# Patient Record
Sex: Male | Born: 1970 | Race: White | Hispanic: No | Marital: Married | Smoking: Current every day smoker
Health system: Southern US, Community
[De-identification: ages and names within clinical notes are randomized; demographics above are authoritative.]

## PROBLEM LIST (undated history)

## (undated) DIAGNOSIS — I10 Essential (primary) hypertension: Secondary | ICD-10-CM

## (undated) HISTORY — PX: BACK SURGERY: SHX140

## (undated) HISTORY — PX: EYE SURGERY: SHX253

---

## 2001-06-27 ENCOUNTER — Emergency Department (HOSPITAL_COMMUNITY): Admission: EM | Admit: 2001-06-27 | Discharge: 2001-06-27 | Payer: Self-pay | Admitting: Emergency Medicine

## 2001-08-15 ENCOUNTER — Emergency Department (HOSPITAL_COMMUNITY): Admission: EM | Admit: 2001-08-15 | Discharge: 2001-08-15 | Payer: Self-pay | Admitting: *Deleted

## 2001-10-07 ENCOUNTER — Observation Stay (HOSPITAL_COMMUNITY): Admission: RE | Admit: 2001-10-07 | Discharge: 2001-10-08 | Payer: Self-pay | Admitting: Neurosurgery

## 2001-10-07 ENCOUNTER — Encounter: Payer: Self-pay | Admitting: Neurosurgery

## 2002-01-25 ENCOUNTER — Ambulatory Visit (HOSPITAL_COMMUNITY): Admission: RE | Admit: 2002-01-25 | Discharge: 2002-01-25 | Payer: Self-pay | Admitting: Internal Medicine

## 2002-01-25 ENCOUNTER — Encounter: Payer: Self-pay | Admitting: Internal Medicine

## 2002-08-14 ENCOUNTER — Ambulatory Visit (HOSPITAL_COMMUNITY): Admission: RE | Admit: 2002-08-14 | Discharge: 2002-08-14 | Payer: Self-pay | Admitting: Internal Medicine

## 2002-08-14 ENCOUNTER — Encounter: Payer: Self-pay | Admitting: Internal Medicine

## 2003-02-05 ENCOUNTER — Emergency Department (HOSPITAL_COMMUNITY): Admission: EM | Admit: 2003-02-05 | Discharge: 2003-02-05 | Payer: Self-pay | Admitting: Emergency Medicine

## 2003-02-21 ENCOUNTER — Encounter: Payer: Self-pay | Admitting: Emergency Medicine

## 2003-02-21 ENCOUNTER — Emergency Department (HOSPITAL_COMMUNITY): Admission: EM | Admit: 2003-02-21 | Discharge: 2003-02-21 | Payer: Self-pay | Admitting: Emergency Medicine

## 2003-05-23 ENCOUNTER — Emergency Department (HOSPITAL_COMMUNITY): Admission: EM | Admit: 2003-05-23 | Discharge: 2003-05-23 | Payer: Self-pay | Admitting: Emergency Medicine

## 2008-10-19 ENCOUNTER — Emergency Department (HOSPITAL_COMMUNITY): Admission: EM | Admit: 2008-10-19 | Discharge: 2008-10-19 | Payer: Self-pay | Admitting: Emergency Medicine

## 2010-10-31 NOTE — Op Note (Signed)
[place]. [facility]  Patient:    [name], [name] Visit Number: [id number] MRN: [id number]          Service Type: SUR Location: 5000 [id number] Attending Physician:  Coletta Memos Dictated by:   Mena Goes. Franky Macho, M.D. Proc. Date: 10/07/01 Admit Date:  10/07/2001 Discharge Date: 10/08/2001                             Operative Report  PREOPERATIVE DIAGNOSES: 1. Displaced disk, left L4-5. 2. Left L5-S1 foraminal stenosis. 3. Left L4 and L5 radiculopathy.  POSTOPERATIVE DIAGNOSES: 1. Displaced disk, left L4-5. 2. Left L5-S1 foraminal stenosis. 3. Left L4 and L5 radiculopathy.  PROCEDURES: 1. Semi-hemilaminectomy and diskectomy, with microdissection L4-5 left side. 2. Left L5 semi-hemilaminectomy and foraminotomy, with microdissection.  COMPLICATIONS:  None.  SURGEON:  Kyle L. Franky Macho, M.D.  ASSISTANT:  Stefani Dama, M.D.  ANESTHESIA:  General endotracheal.  INDICATIONS:  Cinsere Mizrahi has a near-complete foot drop on the left side, secondary to a displaced disk at L5-S1.  He also has foraminal stenosis on the left side at L5-S1.  I have recommended and he has agreed to undergo surgical decompression.  OPERATIVE NOTE:  Mr. Harb was brought to the operating room, intubated and placed under general anesthesia without difficulty.  I placed a spinal needle preoperatively after he was laid prone on a Wilson frame, and all pressure points were properly padded.  Using that needle as a guide, I then pressed and infiltrated 16 cc 0.5% lidocaine with 1:200,000 strength epinephrine.  I then opened the incision with a #10 blade and took this down to the thoracolumbar fascia.  I exposed the lamina of, what I believed to be, L4, L5 and S1.  I took an x-ray placing the double-ended ganglia knife anterior to the lamina at L4, and I was in the correct space.  I then performed semi-hemilaminectomies of L4 and L5, using a high-speed air drill.  I removed the  ligament of Flavum between the L4 and L5 lamina, and also between the L5 and S1 lamina.  I then brought the microscope into the operative field, and proceeded to dissect the thecal sac and nerve root off of the disk herniation at L4.  The L4-5 disk herniation was extremely large and extremely adherent to the dura, and almost had a scarred in appearance.  Using curets, pituitary rongeurs, surgical dynamic tools and a Kerrison punch the disk was removed.  Two drill tears were encountered, WITHOUT spinal fluid leak.  They were repaired primarily.  After decompressing the disk space, I inspected the nerve root of L5 and L4; felt that they were free of compression.  I then turned my attention to L5-S1, with Dr. Tonia Brooms assistance. Dr. Danielle Dess also assisted about the L4-5 space.  At the L5-S1 space it was clear that there was not a disk herniation, but there was some foraminal stenosis.  A foraminotomy was performed with microdissection.  There were no complications encountered.  The wound was then irrigated copiously.  I then placed a small piece of Duragen over the surgically repaired dura at L4-5.  I then closed wound of lower fascia using Vicryl sutures, and subcutaneous tissues were reapproximated with Vicryl sutures.  Dermabond used for a sterile dressing. Dictated by:   Mena Goes. Franky Macho, M.D. Attending Physician:  Coletta Memos DD:  10/07/01 TD:  10/08/01 Job: [id number]

## 2010-10-31 NOTE — H&P (Signed)
[place]. [facility]  Patient:    [name], [name] Visit Number: [id number] MRN: [id number]          Service Type: SUR Location: [phone] Attending Physician:  Coletta Memos Dictated by:   Mena Goes. Franky Macho, M.D. Admit Date:  10/07/2001                           History and Physical  ADMITTING DIAGNOSES: 1. ______ disk left L4-5. 2. Left L5-S1 foraminotomy. 3. Left L5 radiculopathy.  INDICATIONS:  [name] is a 40 year old gentleman who presented to my office April 15 for evaluation of pain and weakness in his left foot and left lower extremity.  He has had numbness of the left thigh and pain behind his left knee.  Mobility is becoming more difficult because he also had some right lower extremity pain.  He first started having problems August 10, 2001.  He was picking up a sliding board while at work and felt his back give out.  He has had no bowel or bladder dysfunction.  He is unable to move his left dorsiflexors and has an effective foot-drop.  PAST MEDICAL HISTORY:  No previous operations.  ALLERGIES:  AMOXICILLIN which causes gastrointestinal upset.  SOCIAL HISTORY:  He smokes two to three packs of cigarettes a day.  He does not use alcohol or illicit drugs.  Weight has been steady.  He is 6 feet 1 inches tall and weighing 202 pounds.  REVIEW OF SYSTEMS:  GENERAL:  Positive for night sweats and excessive thirst. ENT:  Positive for tinnitus, balance problems, sinus problems.  CARDIAC: Positive for hypertension.  MUSCULOSKELETAL:  Positive for leg pain, leg weakness, back pain, joint pain, neck pain.  PSYCHOLOGICAL:  Positive for anxiety and depression.  Denies EYE, GI, GU, SKIN, NEUROLOGIC, HEMATOLOGIC, and ALLERGIC problems.  CURRENT MEDICATIONS:  Prilosec, Flexeril, Voltaren, and Percocet which he is taking four to six per day.  PHYSICAL EXAMINATION:  VITAL SIGNS:  Pulse 92.  HEENT:  Pupils equal, round and react to light.  Full  extraocular movements. Normal funduscopic exam and normal visual fields.  Symmetric facial sensation and movement.  Hearing intact to finger rub bilaterally.  The uvula elevates in the midline.  Shoulder shrug is normal.  Tongue protrudes in the midline.  NEUROLOGIC:  Strength is 5/5 in the upper and right lower extremity. Foot-drop left lower extremity, weakness, about 3/5 left dorsiflexors and extensor hallucis longus.  He cannot heel walk.  Great difficulty with toe walking.  Weakness in the left gastrocnemius 4/5.  NECK:  No cervical masses or bruits.  LUNGS:  Fields are clear.  HEART:  Regular rhythm and rate.  No murmurs or rubs.  Pulse good at the wrists and feet bilaterally.  MRI:  Shows a very large disk herniation at L4-5 on the left side compromising the left L5 root and a pseudo-bulge at L5-S1 secondary to grade I spondylolisthesis.  He has pars defects bilaterally.  PLAIN FILMS:  Showed no instability on flexion and extension views.  IMPRESSION:  I believe [name] needs to have an operation due to weakness in the left foot causing almost a complete foot-drop and that decompression of the nerve root is the best course of action.  The risks of the procedure including bleeding, infection, no pain relief, and bowel or bladder dysfunction were discussed.  He understands and is willing to proceed. Dictated by:   Mena Goes.  Franky Macho, M.D. Attending Physician:  Coletta Memos DD:  10/07/01 TD:  10/08/01 Job: [id number]

## 2011-09-15 ENCOUNTER — Other Ambulatory Visit: Payer: Self-pay | Admitting: Plastic Surgery

## 2011-11-17 ENCOUNTER — Emergency Department (HOSPITAL_COMMUNITY)
Admission: EM | Admit: 2011-11-17 | Discharge: 2011-11-17 | Disposition: A | Discharge status: Home / Self Care | Payer: Self-pay | Attending: Emergency Medicine | Admitting: Emergency Medicine

## 2011-11-17 ENCOUNTER — Encounter (HOSPITAL_COMMUNITY): Payer: Self-pay | Admitting: *Deleted

## 2011-11-17 DIAGNOSIS — F17200 Nicotine dependence, unspecified, uncomplicated: Secondary | ICD-10-CM | POA: Insufficient documentation

## 2011-11-17 DIAGNOSIS — I10 Essential (primary) hypertension: Secondary | ICD-10-CM | POA: Insufficient documentation

## 2011-11-17 DIAGNOSIS — H109 Unspecified conjunctivitis: Secondary | ICD-10-CM | POA: Insufficient documentation

## 2011-11-17 HISTORY — DX: Essential (primary) hypertension: I10

## 2011-11-17 MED ORDER — TOBRAMYCIN 0.3 % OP SOLN
1.0000 [drp] | Freq: Once | OPHTHALMIC | Status: AC
  Administered 2011-11-17: 1 [drp] via OPHTHALMIC
  Filled 2011-11-17: qty 5

## 2011-11-17 NOTE — Discharge Instructions (Signed)
Put 1 drop of tobramycin eyedrops in the right eye 4 times a day for 3 days. He can followup if needed by returning to [facility] ED, or by making a followup appointment with Dr. Marlynn Perking Conjunctivitis is commonly called "pink eye." Conjunctivitis can be caused by bacterial or viral infection, allergies, or injuries. There is usually redness of the lining of the eye, itching, discomfort, and sometimes discharge. There may be deposits of matter along the eyelids. A viral infection usually causes a watery discharge, while a bacterial infection causes a yellowish, thick discharge. Pink eye is very contagious and spreads by direct contact. You may be given antibiotic eyedrops as part of your treatment. Before using your eye medicine, remove all drainage from the eye by washing gently with warm water and cotton balls. Continue to use the medication until you have awakened 2 mornings in a row without discharge from the eye. Do not rub your eye. This increases the irritation and helps spread infection. Use separate towels from other household members. Wash your hands with soap and water before and after touching your eyes. Use cold compresses to reduce pain and sunglasses to relieve irritation from light. Do not wear contact lenses or wear eye makeup until the infection is gone. SEEK MEDICAL CARE IF:   Your symptoms are not better after 3 days of treatment.   You have increased pain or trouble seeing.   The outer eyelids become very red or swollen.  Document Released: 07/09/2004 Document Revised: 05/21/2011 Document Reviewed: 06/01/2005 [facility] Patient Information 2012 [place], [place]. the ophthalmologist.

## 2011-11-17 NOTE — ED Provider Notes (Signed)
History     CSN: [id number]  Arrival date & time 11/17/11  1614   First MD Initiated Contact with Patient 11/17/11 1624      Chief Complaint  Patient presents with  . Conjunctivitis    (Consider location/radiation/quality/duration/timing/severity/associated sxs/prior treatment) Patient is a 41 y.o. male presenting with conjunctivitis. The history is provided by the patient. No language interpreter was used.  Conjunctivitis  The current episode started yesterday (The patient has had cataract surgery on both eyes for years ago. He had onset of redness and itching in his right eye yesterday.). The onset was gradual. The problem occurs continuously. The problem has been gradually improving. The problem is mild. The symptoms are relieved by nothing. The symptoms are aggravated by nothing. Associated symptoms include eye itching and eye redness. Pertinent negatives include no fever, no decreased vision and no eye discharge. The eye pain is mild. There is pain in the right eye. The eye pain is not associated with movement. The eyelid exhibits redness. There were no sick contacts. He has received no recent medical care.    Past Medical History  Diagnosis Date  . Hypertension     Past Surgical History  Procedure Date  . Back surgery   . Eye surgery     History reviewed. No pertinent family history.  History  Substance Use Topics  . Smoking status: Current Everyday Smoker -- 2.0 packs/day  . Smokeless tobacco: Not on file  . Alcohol Use: Yes     seldom      Review of Systems  Constitutional: Negative for fever and chills.  HENT: Negative.   Eyes: Positive for redness and itching. Negative for discharge.  Respiratory: Negative.   Cardiovascular: Negative.   Gastrointestinal: Negative.   Genitourinary: Negative.   Musculoskeletal: Negative.   Neurological: Negative.   Psychiatric/Behavioral: Negative.     Allergies  Amoxicillin  Home Medications  No current outpatient  prescriptions on file.  BP 155/99  Pulse 86  Temp(Src) 98 F (36.7 C) (Oral)  Resp 16  Ht 6\' 1"  (1.854 m)  Wt 205 lb (92.987 kg)  BMI 27.05 kg/m2  SpO2 98%  Physical Exam  Nursing note and vitals reviewed. Constitutional: He is oriented to person, place, and time. He appears well-developed and well-nourished.  HENT:  Head: Normocephalic and atraumatic.  Right Ear: External ear normal.  Left Ear: External ear normal.  Mouth/Throat: Oropharynx is clear and moist.  Eyes:       Mild redness of the bulbar and palpebral conjunctiva of the right eye. There is no foreign body. The cornea is clear. Anterior chamber and fundus are normal in the right eye. The left eye is normal.  Neck: Normal range of motion.  Lymphadenopathy:    He has no cervical adenopathy.  Neurological: He is alert and oriented to person, place, and time.       Sensory or motor deficit.  Skin: Skin is warm and dry. No rash noted.  Psychiatric: He has a normal mood and affect. His behavior is normal.    ED Course  Procedures (including critical care time)  4:40 PM Patient appears to have a mild conjunctivitis. Treatment with Tobrex drops, one drop in the right eye 4 times a day for 3 days.  1. Conjunctivitis          Carleene Cooper III, MD 11/17/11 [phone]

## 2011-11-17 NOTE — ED Notes (Signed)
Pt c/o itching and redness to his right eye since last night. Denies pain.

## 2012-02-19 ENCOUNTER — Encounter (HOSPITAL_COMMUNITY): Payer: Self-pay

## 2012-02-19 ENCOUNTER — Emergency Department (HOSPITAL_COMMUNITY)
Admission: EM | Admit: 2012-02-19 | Discharge: 2012-02-19 | Disposition: A | Discharge status: Home / Self Care | Payer: Medicaid Other | Attending: Emergency Medicine | Admitting: Emergency Medicine

## 2012-02-19 DIAGNOSIS — K5289 Other specified noninfective gastroenteritis and colitis: Secondary | ICD-10-CM | POA: Insufficient documentation

## 2012-02-19 DIAGNOSIS — Z880 Allergy status to penicillin: Secondary | ICD-10-CM | POA: Insufficient documentation

## 2012-02-19 DIAGNOSIS — F17200 Nicotine dependence, unspecified, uncomplicated: Secondary | ICD-10-CM | POA: Insufficient documentation

## 2012-02-19 DIAGNOSIS — K529 Noninfective gastroenteritis and colitis, unspecified: Secondary | ICD-10-CM

## 2012-02-19 DIAGNOSIS — I10 Essential (primary) hypertension: Secondary | ICD-10-CM | POA: Insufficient documentation

## 2012-02-19 LAB — CBC WITH DIFFERENTIAL/PLATELET
Eosinophils Absolute: 0.1 10*3/uL (ref 0.0–0.7)
Eosinophils Relative: 2 % (ref 0–5)
HCT: 43.7 % (ref 39.0–52.0)
Lymphocytes Relative: 25 % (ref 12–46)
Lymphs Abs: 1.9 10*3/uL (ref 0.7–4.0)
MCH: 29.2 pg (ref 26.0–34.0)
MCV: 85.5 fL (ref 78.0–100.0)
Monocytes Absolute: 0.6 10*3/uL (ref 0.1–1.0)
RDW: 13.4 % (ref 11.5–15.5)
WBC: 7.4 10*3/uL (ref 4.0–10.5)

## 2012-02-19 LAB — BASIC METABOLIC PANEL
CO2: 26 mEq/L (ref 19–32)
Calcium: 9.9 mg/dL (ref 8.4–10.5)
Creatinine, Ser: 0.81 mg/dL (ref 0.50–1.35)
Glucose, Bld: 99 mg/dL (ref 70–99)

## 2012-02-19 MED ORDER — DIPHENOXYLATE-ATROPINE 2.5-0.025 MG PO TABS
2.0000 | ORAL_TABLET | Freq: Once | ORAL | Status: AC
  Administered 2012-02-19: 2 via ORAL
  Filled 2012-02-19: qty 2

## 2012-02-19 MED ORDER — DIPHENOXYLATE-ATROPINE 2.5-0.025 MG PO TABS: 1.0000 | ORAL_TABLET | Freq: Four times a day (QID) | ORAL | Status: AC | PRN

## 2012-02-19 MED ORDER — PROMETHAZINE HCL 25 MG PO TABS: 25.0000 mg | ORAL_TABLET | Freq: Four times a day (QID) | ORAL | Status: DC | PRN

## 2012-02-19 NOTE — ED Provider Notes (Signed)
History     CSN: [id number]  Arrival date & time 02/19/12  1020   First MD Initiated Contact with Patient 02/19/12 1041      Chief Complaint  Patient presents with  . Anorexia    (Consider location/radiation/quality/duration/timing/severity/associated sxs/prior treatment) HPI Comments: [name] presents for evaluation of decreased appetite, nausea without emesis and one episode of diarrhea.  The nausea and anorexia started yesterday morning and although he's been unable to tolerate eating, stating "nothing tastes good", he has been able to tolerate fluids.  He denies abdominal pain, fever, shortness of breath or chest pain.  He had 2 small diarrheal bowel movements this morning which were nonbloody and without mucus.  He is taking no medications for his symptoms.  He denies weakness or lightheadedness, no headache, rash, dysuria, hematuria or other complaints.  No other household members are ill.  His last full meal was 2 nights ago, chicken from a local fast food restaurant which tasted good and appeared properly cooked.   The history is provided by the patient.    Past Medical History  Diagnosis Date  . Hypertension     Past Surgical History  Procedure Date  . Back surgery   . Eye surgery     No family history on file.  History  Substance Use Topics  . Smoking status: Current Everyday Smoker -- 2.0 packs/day  . Smokeless tobacco: Not on file  . Alcohol Use: Yes     seldom      Review of Systems  Constitutional: Positive for appetite change. Negative for fever.  HENT: Negative for congestion, sore throat and neck pain.   Eyes: Negative.   Respiratory: Negative for chest tightness and shortness of breath.   Cardiovascular: Negative for chest pain.  Gastrointestinal: Positive for nausea and diarrhea. Negative for abdominal pain.  Genitourinary: Negative.   Musculoskeletal: Negative for joint swelling and arthralgias.  Skin: Negative.  Negative for rash and  wound.  Neurological: Negative for dizziness, weakness, light-headedness, numbness and headaches.  Hematological: Negative.   Psychiatric/Behavioral: Negative.     Allergies  Amoxicillin  Home Medications   Current Outpatient Rx  Name Route Sig Dispense Refill  . IBUPROFEN 200 MG PO TABS Oral Take 600 mg by mouth every 6 (six) hours as needed. Headache.    Marland Kitchen DIPHENOXYLATE-ATROPINE 2.5-0.025 MG PO TABS Oral Take 1 tablet by mouth 4 (four) times daily as needed for diarrhea or loose stools. 15 tablet 0  . PROMETHAZINE HCL 25 MG PO TABS Oral Take 1 tablet (25 mg total) by mouth every 6 (six) hours as needed for nausea. 12 tablet 0    BP 125/94  Pulse 86  Temp 98.6 F (37 C) (Oral)  Resp 20  Ht 6\' 1"  (1.854 m)  Wt 205 lb (92.987 kg)  BMI 27.05 kg/m2  SpO2 98%  Physical Exam  Nursing note and vitals reviewed. Constitutional: He appears well-developed and well-nourished.  HENT:  Head: Normocephalic and atraumatic.  Mouth/Throat: Uvula is midline and mucous membranes are normal. Mucous membranes are not dry.  Eyes: Conjunctivae are normal.  Neck: Normal range of motion.  Cardiovascular: Normal rate, regular rhythm, normal heart sounds and intact distal pulses.   Pulmonary/Chest: Effort normal and breath sounds normal. He has no wheezes.  Abdominal: Soft. He exhibits no distension. Bowel sounds are increased. There is no tenderness. There is no rigidity, no guarding and no CVA tenderness.  Musculoskeletal: Normal range of motion.  Neurological: He is alert.  Skin: Skin is warm and dry.  Psychiatric: He has a normal mood and affect.    ED Course  Procedures (including critical care time)   Labs Reviewed  CBC WITH DIFFERENTIAL  BASIC METABOLIC PANEL   No results found.   Results for orders placed during the hospital encounter of 02/19/12  CBC WITH DIFFERENTIAL      Component Value Range   WBC 7.4  4.0 - 10.5 K/uL   RBC 5.11  4.22 - 5.81 MIL/uL   Hemoglobin 14.9  13.0  - 17.0 g/dL   HCT 45.4  09.8 - 11.9 %   MCV 85.5  78.0 - 100.0 fL   MCH 29.2  26.0 - 34.0 pg   MCHC 34.1  30.0 - 36.0 g/dL   RDW 14.7  82.9 - 56.2 %   Platelets 271  150 - 400 K/uL   Neutrophils Relative 66  43 - 77 %   Neutro Abs 4.9  1.7 - 7.7 K/uL   Lymphocytes Relative 25  12 - 46 %   Lymphs Abs 1.9  0.7 - 4.0 K/uL   Monocytes Relative 8  3 - 12 %   Monocytes Absolute 0.6  0.1 - 1.0 K/uL   Eosinophils Relative 2  0 - 5 %   Eosinophils Absolute 0.1  0.0 - 0.7 K/uL   Basophils Relative 0  0 - 1 %   Basophils Absolute 0.0  0.0 - 0.1 K/uL  BASIC METABOLIC PANEL      Component Value Range   Sodium 138  135 - 145 mEq/L   Potassium 4.1  3.5 - 5.1 mEq/L   Chloride 103  96 - 112 mEq/L   CO2 26  19 - 32 mEq/L   Glucose, Bld 99  70 - 99 mg/dL   BUN 12  6 - 23 mg/dL   Creatinine, Ser 1.30  0.50 - 1.35 mg/dL   Calcium 9.9  8.4 - 86.5 mg/dL   GFR calc non Af Amer >90  >90 mL/min   GFR calc Af Amer >90  >90 mL/min     1. Gastroenteritis       MDM  Labs reviewed and discussed with patient.  Patient was given a fluid challenge while here and tolerated well without increased nausea.  He was given prescriptions for Phenergan and Lomotil with his first dose of Lomotil given prior to discharge home.  He was encouraged rest, increased fluids, discussed brat diet or other bland diet.  Recheck if not improving over the next one to 2 days.  Suspect viral gastroenteritis.  The patient appears reasonably screened and/or stabilized for discharge and I doubt any other medical condition or other [facility] requiring further screening, evaluation, or treatment in the ED at this time prior to discharge.       Burgess Amor, [place] 02/19/12 1701

## 2012-02-19 NOTE — ED Notes (Signed)
Unable to eat since Wednesday. Denies fever, n/v. States he had one episode of diarrhea this morning

## 2012-02-23 NOTE — ED Provider Notes (Signed)
Medical screening examination/treatment/procedure(s) were performed by non-physician practitioner and as supervising physician I was immediately available for consultation/collaboration.   Yittel Emrich W Richrd Kuzniar, MD 02/23/12 1709 

## 2012-10-29 ENCOUNTER — Emergency Department (HOSPITAL_COMMUNITY)
Admission: EM | Admit: 2012-10-29 | Discharge: 2012-10-29 | Disposition: A | Discharge status: Home / Self Care | Payer: Self-pay | Attending: Emergency Medicine | Admitting: Emergency Medicine

## 2012-10-29 ENCOUNTER — Encounter (HOSPITAL_COMMUNITY): Payer: Self-pay

## 2012-10-29 DIAGNOSIS — R51 Headache: Secondary | ICD-10-CM | POA: Insufficient documentation

## 2012-10-29 DIAGNOSIS — K047 Periapical abscess without sinus: Secondary | ICD-10-CM | POA: Insufficient documentation

## 2012-10-29 DIAGNOSIS — F17200 Nicotine dependence, unspecified, uncomplicated: Secondary | ICD-10-CM | POA: Insufficient documentation

## 2012-10-29 DIAGNOSIS — K029 Dental caries, unspecified: Secondary | ICD-10-CM | POA: Insufficient documentation

## 2012-10-29 DIAGNOSIS — K089 Disorder of teeth and supporting structures, unspecified: Secondary | ICD-10-CM | POA: Insufficient documentation

## 2012-10-29 DIAGNOSIS — Z880 Allergy status to penicillin: Secondary | ICD-10-CM | POA: Insufficient documentation

## 2012-10-29 DIAGNOSIS — R6884 Jaw pain: Secondary | ICD-10-CM | POA: Insufficient documentation

## 2012-10-29 DIAGNOSIS — I10 Essential (primary) hypertension: Secondary | ICD-10-CM | POA: Insufficient documentation

## 2012-10-29 MED ORDER — CLINDAMYCIN HCL 150 MG PO CAPS: 150.0000 mg | ORAL_CAPSULE | Freq: Three times a day (TID) | ORAL | Status: DC

## 2012-10-29 MED ORDER — BUPIVACAINE HCL (PF) 0.25 % IJ SOLN
INTRAMUSCULAR | Status: AC
  Filled 2012-10-29: qty 30

## 2012-10-29 MED ORDER — BUPIVACAINE HCL (PF) 0.25 % IJ SOLN: 30.0000 mL | Freq: Once | INTRAMUSCULAR | Status: DC

## 2012-10-29 MED ORDER — KETOROLAC TROMETHAMINE 10 MG PO TABS
10.0000 mg | ORAL_TABLET | Freq: Once | ORAL | Status: AC
  Administered 2012-10-29: 10 mg via ORAL
  Filled 2012-10-29: qty 1

## 2012-10-29 MED ORDER — HYDROCODONE-ACETAMINOPHEN 5-325 MG PO TABS
2.0000 | ORAL_TABLET | Freq: Once | ORAL | Status: AC
  Administered 2012-10-29: 2 via ORAL
  Filled 2012-10-29: qty 2

## 2012-10-29 MED ORDER — CLINDAMYCIN HCL 150 MG PO CAPS
300.0000 mg | ORAL_CAPSULE | Freq: Once | ORAL | Status: AC
  Administered 2012-10-29: 300 mg via ORAL
  Filled 2012-10-29: qty 2

## 2012-10-29 MED ORDER — HYDROCODONE-ACETAMINOPHEN 5-325 MG PO TABS: 1.0000 | ORAL_TABLET | ORAL | Status: DC | PRN

## 2012-10-29 MED ORDER — ONDANSETRON HCL 4 MG PO TABS
4.0000 mg | ORAL_TABLET | Freq: Once | ORAL | Status: AC
  Administered 2012-10-29: 4 mg via ORAL
  Filled 2012-10-29: qty 1

## 2012-10-29 NOTE — ED Notes (Signed)
Pt c/o right side dental pain since last night. Abscess present on upper right side of mouth. Pt denies any drainage from area.

## 2012-10-29 NOTE — ED Provider Notes (Signed)
Patient seen/examined in the Emergency Department in conjunction with Midlevel Provider [facility] Patient reports dental pain/swelling Exam : dental abscess noted, no trismus Plan: will need drainage, antibiotics and dental followup   Joya Gaskins, MD 10/29/12 1228

## 2012-10-29 NOTE — ED Provider Notes (Signed)
History     CSN: [id number]  Arrival date & time 10/29/12  1147   First MD Initiated Contact with Patient 10/29/12 1157      Chief Complaint  Patient presents with  . Dental Pain    (Consider location/radiation/quality/duration/timing/severity/associated sxs/prior treatment) Patient is a 42 y.o. male presenting with tooth pain. The history is provided by the patient.  Dental PainThe primary symptoms include mouth pain and headaches. Primary symptoms do not include shortness of breath or cough. The symptoms are worsening. The symptoms are chronic. The symptoms occur constantly.  The headache is not associated with photophobia.  Additional symptoms include: gum swelling, gum tenderness, jaw pain and facial swelling. Additional symptoms do not include: trouble swallowing and nosebleeds. Medical issues include: smoking.    Past Medical History  Diagnosis Date  . Hypertension     Past Surgical History  Procedure Laterality Date  . Back surgery    . Eye surgery      No family history on file.  History  Substance Use Topics  . Smoking status: Current Every Day Smoker -- 2.00 packs/day    Types: Cigarettes  . Smokeless tobacco: Not on file  . Alcohol Use: Yes     Comment: seldom      Review of Systems  Constitutional: Negative for activity change.       All ROS Neg except as noted in HPI  HENT: Positive for facial swelling. Negative for nosebleeds, trouble swallowing and neck pain.   Eyes: Negative for photophobia and discharge.  Respiratory: Negative for cough, shortness of breath and wheezing.   Cardiovascular: Negative for chest pain and palpitations.  Gastrointestinal: Negative for abdominal pain and blood in stool.  Genitourinary: Negative for dysuria, frequency and hematuria.  Musculoskeletal: Negative for back pain and arthralgias.  Skin: Negative.   Neurological: Positive for headaches. Negative for dizziness, seizures and speech difficulty.   Psychiatric/Behavioral: Negative for hallucinations and confusion.    Allergies  Amoxicillin  Home Medications   Current Outpatient Rx  Name  Route  Sig  Dispense  Refill  . ibuprofen (ADVIL,MOTRIN) 200 MG tablet   Oral   Take 600 mg by mouth every 6 (six) hours as needed. Headache.         . naproxen sodium (ALEVE) 220 MG tablet   Oral   Take 440 mg by mouth daily as needed (pain).         . clindamycin (CLEOCIN) 150 MG capsule   Oral   Take 1 capsule (150 mg total) by mouth 3 (three) times daily.   21 capsule   0   . HYDROcodone-acetaminophen (NORCO/VICODIN) 5-325 MG per tablet   Oral   Take 1 tablet by mouth every 4 (four) hours as needed for pain.   20 tablet   0     BP 161/105  Pulse 87  Temp(Src) 99 F (37.2 C) (Oral)  Resp 18  Ht 6\' 1"  (1.854 m)  Wt 205 lb (92.987 kg)  BMI 27.05 kg/m2  SpO2 99%  Physical Exam  Nursing note and vitals reviewed. Constitutional: He is oriented to person, place, and time. He appears well-developed and well-nourished.  Non-toxic appearance.  HENT:  Head: Normocephalic.  Right Ear: Tympanic membrane and external ear normal.  Left Ear: Tympanic membrane and external ear normal.  Mouth/Throat: Uvula is midline and oropharynx is clear and moist. Dental abscesses and dental caries present.  Diffuse dental caries of upper and lower jaws.  Eyes: EOM and lids are  normal. Pupils are equal, round, and reactive to light.  Neck: Normal range of motion. Neck supple. Carotid bruit is not present.  Cardiovascular: Normal rate, regular rhythm, normal heart sounds, intact distal pulses and normal pulses.   No murmur heard. Pulmonary/Chest: Breath sounds normal. No respiratory distress.  Abdominal: Soft. Bowel sounds are normal. There is no tenderness. There is no guarding.  Musculoskeletal: Normal range of motion.  Lymphadenopathy:       Head (right side): No submandibular adenopathy present.       Head (left side): No submandibular  adenopathy present.    He has no cervical adenopathy.  Neurological: He is alert and oriented to person, place, and time. He has normal strength. No cranial nerve deficit or sensory deficit.  Skin: Skin is warm and dry.  Psychiatric: He has a normal mood and affect. His speech is normal.    ED Course  Dental Date/Time: 10/29/2012 1:45 PM Performed by: Kathie Dike Authorized by: Kathie Dike Consent: Verbal consent obtained. Risks and benefits: risks, benefits and alternatives were discussed Consent given by: patient Patient understanding: patient states understanding of the procedure being performed Patient identity confirmed: arm band Time out: Immediately prior to procedure a "time out" was called to verify the correct patient, procedure, equipment, support staff and site/side marked as required. Local anesthesia used: yes Anesthesia: nerve block Local anesthetic: bupivacaine 0.25% without epinephrine Patient sedated: no Patient tolerance: Patient tolerated the procedure well with no immediate complications. Comments: Abscess of the right upper jaw drained with moderate amount of pus like material.   (including critical care time)  Labs Reviewed - No data to display No results found.   1. Dental abscess       MDM  I have reviewed nursing notes, vital signs, and all appropriate lab and imaging results for this patient. Pt has advanced dental caries and infections. Abscess drained in ED. Pt placed on clindamycin, and norco. He is to continue the aleve. Dental resources given to the patient. Pt to return if any changes or problem.        Kathie Dike, PA-C 10/29/12 1348

## 2012-10-29 NOTE — ED Notes (Signed)
Dental pain to right top of mouth since yesterday. Swelling noted.

## 2012-10-30 NOTE — ED Provider Notes (Signed)
Medical screening examination/treatment/procedure(s) were conducted as a shared visit with non-physician practitioner(s) and myself.  I personally evaluated the patient during the encounter   Joya Gaskins, MD 10/30/12 [phone]

## 2014-04-01 ENCOUNTER — Encounter (HOSPITAL_COMMUNITY): Payer: Self-pay | Admitting: Emergency Medicine

## 2014-04-01 ENCOUNTER — Emergency Department (HOSPITAL_COMMUNITY)
Admission: EM | Admit: 2014-04-01 | Discharge: 2014-04-01 | Disposition: A | Discharge status: Home / Self Care | Payer: Medicaid Other | Attending: Emergency Medicine | Admitting: Emergency Medicine

## 2014-04-01 DIAGNOSIS — Z880 Allergy status to penicillin: Secondary | ICD-10-CM | POA: Insufficient documentation

## 2014-04-01 DIAGNOSIS — K029 Dental caries, unspecified: Secondary | ICD-10-CM | POA: Insufficient documentation

## 2014-04-01 DIAGNOSIS — I10 Essential (primary) hypertension: Secondary | ICD-10-CM | POA: Insufficient documentation

## 2014-04-01 DIAGNOSIS — K047 Periapical abscess without sinus: Secondary | ICD-10-CM | POA: Insufficient documentation

## 2014-04-01 DIAGNOSIS — Z720 Tobacco use: Secondary | ICD-10-CM | POA: Insufficient documentation

## 2014-04-01 MED ORDER — OXYCODONE-ACETAMINOPHEN 5-325 MG PO TABS: 1.0000 | ORAL_TABLET | ORAL | Status: DC | PRN

## 2014-04-01 MED ORDER — CLINDAMYCIN HCL 300 MG PO CAPS: 300.0000 mg | ORAL_CAPSULE | Freq: Four times a day (QID) | ORAL | Status: DC

## 2014-04-01 MED ORDER — CLINDAMYCIN HCL 150 MG PO CAPS
300.0000 mg | ORAL_CAPSULE | Freq: Once | ORAL | Status: AC
  Administered 2014-04-01: 300 mg via ORAL
  Filled 2014-04-01: qty 2

## 2014-04-01 MED ORDER — HYDROCODONE-ACETAMINOPHEN 5-325 MG PO TABS
1.0000 | ORAL_TABLET | Freq: Once | ORAL | Status: AC
  Administered 2014-04-01: 1 via ORAL
  Filled 2014-04-01: qty 1

## 2014-04-01 MED ORDER — HYDROCHLOROTHIAZIDE 25 MG PO TABS: 25.0000 mg | ORAL_TABLET | Freq: Every day | ORAL | Status: DC

## 2014-04-01 NOTE — ED Provider Notes (Signed)
CSN: [id number]     Arrival date & time 04/01/14  1446 History  This chart was scribed for non-physician practitioner, Pauline Ausammy Doniven Vanpatten, PA-C,working with Enid SkeensJoshua M Zavitz, MD, by Karle PlumberJennifer Tensley, ED Scribe. This patient was seen in room APFT23/APFT23 and the patient's care was started at 3:45 PM.  Chief Complaint  Patient presents with  . Dental Pain   The history is provided by the patient. No language interpreter was used.   HPI Comments:  [name] is a 43 y.o. male with PMHx of HTN who presents to the Emergency Department complaining of moderate upper right sided dental pain in the mouth that began two days ago. Pt reports right-sided facial pain, associated bleeding and purulent drainage from the site. He has taken OTC Advil and applied Orajel with no relief of the pain. Eating makes the pain worse. He denies any alleviating factors. He denies fever, chills, SOB, difficulty swallowing, nausea or vomiting.  Pt has h/o HTN but states he has not been on medication in the past several years. He was taking HCTZ. He reports intermittent HA but denies any today. He denies CP, dizziness, weakness or visual changes.  Past Medical History  Diagnosis Date  . Hypertension    Past Surgical History  Procedure Laterality Date  . Back surgery    . Eye surgery     History reviewed. No pertinent family history. History  Substance Use Topics  . Smoking status: Current Every Day Smoker -- 1.00 packs/day    Types: Cigarettes  . Smokeless tobacco: Not on file  . Alcohol Use: Yes     Comment: seldom    Review of Systems  Constitutional: Negative for fever, chills and fatigue.  HENT: Positive for dental problem and facial swelling. Negative for sore throat and trouble swallowing.   Respiratory: Negative for cough, shortness of breath and wheezing.   Cardiovascular: Negative for chest pain and palpitations.  Gastrointestinal: Negative for nausea, vomiting, abdominal pain and blood in stool.   Genitourinary: Negative for dysuria, hematuria and flank pain.  Musculoskeletal: Negative for arthralgias, back pain, myalgias, neck pain and neck stiffness.  Skin: Negative for rash.  Neurological: Negative for dizziness, weakness, numbness and headaches.  Hematological: Does not bruise/bleed easily.    Allergies  Amoxicillin  Home Medications   Prior to Admission medications   Medication Sig Start Date End Date Taking? Authorizing Provider  ibuprofen (ADVIL,MOTRIN) 200 MG tablet Take 400-800 mg by mouth every 6 (six) hours as needed. Headache.   Yes Historical Provider, MD   Triage Vitals: BP 195/115  Pulse 82  Temp(Src) 98.4 F (36.9 C) (Oral)  Resp 18  Ht 6\' 1"  (1.854 m)  Wt 200 lb (90.719 kg)  BMI 26.39 kg/m2  SpO2 100% Physical Exam  Nursing note and vitals reviewed. Constitutional: He is oriented to person, place, and time. He appears well-developed and well-nourished.  HENT:  Head: Normocephalic and atraumatic.  Mouth/Throat: Uvula is midline, oropharynx is clear and moist and mucous membranes are normal. No trismus in the jaw. Dental abscesses and dental caries present. No uvula swelling.  Right upper first premolar with fluctuant mass around surrounding gingiva with purulent drainage.  Eyes: Conjunctivae and EOM are normal. Pupils are equal, round, and reactive to light.  Neck: Normal range of motion. Neck supple.  Cardiovascular: Normal rate, regular rhythm, normal heart sounds and intact distal pulses.  Exam reveals no gallop and no friction rub.   No murmur heard. Pulmonary/Chest: Effort normal. No respiratory distress.  He has no rales.  Musculoskeletal: Normal range of motion.  Lymphadenopathy:    He has no cervical adenopathy.  Neurological: He is alert and oriented to person, place, and time. He exhibits normal muscle tone. Coordination normal.  Skin: Skin is warm and dry.  Psychiatric: He has a normal mood and affect. His behavior is normal.    ED  Course  Procedures (including critical care time) DIAGNOSTIC STUDIES: Oxygen Saturation is 100% on RA, normal by my interpretation.   COORDINATION OF CARE: 3:53 PM- Will prescribe Clindamycin and pain medication. Will provide dental resource guide. Will prescribe HCTZ since pt has been on that in the past and advised to follow up with PCP. Pt verbalizes understanding and agrees to plan.  Medications - No data to display  Labs Review Labs Reviewed - No data to display  Imaging Review No results found.   EKG Interpretation None      MDM   Final diagnoses:  Dental abscess  Essential hypertension   Abscess spontaneously began to drain at onset of oral exam.  Airway is patent.  No concerning sx's for infection to the floor of the mouth or deep structures of the neck.  Dental referral list given and refill for patient's HCTZ.  Pt agrees to close f/u regarding his HTN and resource list given as well.     I personally performed the services described in this documentation, which was scribed in my presence. The recorded information has been reviewed and is accurate.    Arlyne Brandes L. Trisha Mangleriplett, PA-C 04/02/14 2229

## 2014-04-01 NOTE — ED Notes (Signed)
Patient with no complaints at this time. Respirations even and unlabored. Skin warm/dry. Discharge instructions reviewed with patient at this time. Patient given opportunity to voice concerns/ask questions. Patient discharged at this time and left Emergency Department with steady gait.   

## 2014-04-01 NOTE — Discharge Instructions (Signed)
Dental Abscess A dental abscess is a collection of infected fluid (pus) from a bacterial infection in the inner part of the tooth (pulp). It usually occurs at the end of the tooth's root.  CAUSES   Severe tooth decay.  Trauma to the tooth that allows bacteria to enter into the pulp, such as a broken or chipped tooth. SYMPTOMS   Severe pain in and around the infected tooth.  Swelling and redness around the abscessed tooth or in the mouth or face.  Tenderness.  Pus drainage.  Bad breath.  Bitter taste in the mouth.  Difficulty swallowing.  Difficulty opening the mouth.  Nausea.  Vomiting.  Chills.  Swollen neck glands. DIAGNOSIS   A medical and dental history will be taken.  An examination will be performed by tapping on the abscessed tooth.  X-rays may be taken of the tooth to identify the abscess. TREATMENT The goal of treatment is to eliminate the infection. You may be prescribed antibiotic medicine to stop the infection from spreading. A root canal may be performed to save the tooth. If the tooth cannot be saved, it may be pulled (extracted) and the abscess may be drained.  HOME CARE INSTRUCTIONS  Only take over-the-counter or prescription medicines for pain, fever, or discomfort as directed by your caregiver.  Rinse your mouth (gargle) often with salt water ( tsp salt in 8 oz [250 ml] of warm water) to relieve pain or swelling.  Do not drive after taking pain medicine (narcotics).  Do not apply heat to the outside of your face.  Return to your dentist for further treatment as directed. SEEK MEDICAL CARE IF:  Your pain is not helped by medicine.  Your pain is getting worse instead of better. SEEK IMMEDIATE MEDICAL CARE IF:  You have a fever or persistent symptoms for more than 2-3 days.  You have a fever and your symptoms suddenly get worse.  You have chills or a very bad headache.  You have problems breathing or swallowing.  You have trouble  opening your mouth.  You have swelling in the neck or around the eye. Document Released: 06/01/2005 Document Revised: 02/24/2012 Document Reviewed: 09/09/2010 [facility] Patient Information 2015 [place], [place]. This information is not intended to replace advice given to you by your health care provider. Make sure you discuss any questions you have with your health care provider.  Managing Your High Blood Pressure Blood pressure is a measurement of how forceful your blood is pressing against the walls of the arteries. Arteries are muscular tubes within the circulatory system. Blood pressure does not stay the same. Blood pressure rises when you are active, excited, or nervous; and it lowers during sleep and relaxation. If the numbers measuring your blood pressure stay above normal most of the time, you are at risk for health problems. High blood pressure (hypertension) is a long-term (chronic) condition in which blood pressure is elevated. A blood pressure reading is recorded as two numbers, such as 120 over 80 (or 120/80). The first, higher number is called the systolic pressure. It is a measure of the pressure in your arteries as the heart beats. The second, lower number is called the diastolic pressure. It is a measure of the pressure in your arteries as the heart relaxes between beats.  Keeping your blood pressure in a normal range is important to your overall health and prevention of health problems, such as heart disease and stroke. When your blood pressure is uncontrolled, your heart has to work  harder than normal. High blood pressure is a very common condition in adults because blood pressure tends to rise with age. Men and women are equally likely to have hypertension but at different times in life. Before age 107, men are more likely to have hypertension. After 42 years of age, women are more likely to have it. Hypertension is especially common in African Americans. This condition often has no signs  or symptoms. The cause of the condition is usually not known. Your caregiver can help you come up with a plan to keep your blood pressure in a normal, healthy range. BLOOD PRESSURE STAGES Blood pressure is classified into four stages: normal, prehypertension, stage 1, and stage 2. Your blood pressure reading will be used to determine what type of treatment, if any, is necessary. Appropriate treatment options are tied to these four stages:  Normal  Systolic pressure (mm Hg): below 120.  Diastolic pressure (mm Hg): below 80. Prehypertension  Systolic pressure (mm Hg): 120 to 139.  Diastolic pressure (mm Hg): 80 to 89. Stage1  Systolic pressure (mm Hg): 140 to 159.  Diastolic pressure (mm Hg): 90 to 99. Stage2  Systolic pressure (mm Hg): 160 or above.  Diastolic pressure (mm Hg): 100 or above. RISKS RELATED TO HIGH BLOOD PRESSURE Managing your blood pressure is an important responsibility. Uncontrolled high blood pressure can lead to:  A heart attack.  A stroke.  A weakened blood vessel (aneurysm).  Heart failure.  Kidney damage.  Eye damage.  Metabolic syndrome.  Memory and concentration problems. HOW TO MANAGE YOUR BLOOD PRESSURE Blood pressure can be managed effectively with lifestyle changes and medicines (if needed). Your caregiver will help you come up with a plan to bring your blood pressure within a normal range. Your plan should include the following: Education  Read all information provided by your caregivers about how to control blood pressure.  Educate yourself on the latest guidelines and treatment recommendations. New research is always being done to further define the risks and treatments for high blood pressure. Lifestylechanges  Control your weight.  Avoid smoking.  Stay physically active.  Reduce the amount of salt in your diet.  Reduce stress.  Control any chronic conditions, such as high cholesterol or diabetes.  Reduce your alcohol  intake. Medicines  Several medicines (antihypertensive medicines) are available, if needed, to bring blood pressure within a normal range. Communication  Review all the medicines you take with your caregiver because there may be side effects or interactions.  Talk with your caregiver about your diet, exercise habits, and other lifestyle factors that may be contributing to high blood pressure.  See your caregiver regularly. Your caregiver can help you create and adjust your plan for managing high blood pressure. RECOMMENDATIONS FOR TREATMENT AND FOLLOW-UP  The following recommendations are based on current guidelines for managing high blood pressure in nonpregnant adults. Use these recommendations to identify the proper follow-up period or treatment option based on your blood pressure reading. You can discuss these options with your caregiver.  Systolic pressure of 120 to 139 or diastolic pressure of 80 to 89: Follow up with your caregiver as directed.  Systolic pressure of 140 to 160 or diastolic pressure of 90 to 100: Follow up with your caregiver within 2 months.  Systolic pressure above 160 or diastolic pressure above 100: Follow up with your caregiver within 1 month.  Systolic pressure above 180 or diastolic pressure above 110: Consider antihypertensive therapy; follow up with your caregiver within 1 week.  Systolic pressure above 200 or diastolic pressure above 120: Begin antihypertensive therapy; follow up with your caregiver within 1 week. Document Released: 02/24/2012 Document Reviewed: 02/24/2012 [facility] Patient Information 2015 [place], [place]. This information is not intended to replace advice given to you by your health care provider. Make sure you discuss any questions you have with your health care provider.    Emergency Department Resource Guide 1) Find a Doctor and Pay Out of Pocket Although you won't have to find out who is covered by your insurance plan, it is a good  idea to ask around and get recommendations. You will then need to call the office and see if the doctor you have chosen will accept you as a new patient and what types of options they offer for patients who are self-pay. Some doctors offer discounts or will set up payment plans for their patients who do not have insurance, but you will need to ask so you aren't surprised when you get to your appointment.  2) Contact Your Local Health Department Not all health departments have doctors that can see patients for sick visits, but many do, so it is worth a call to see if yours does. If you don't know where your local health department is, you can check in your phone book. The CDC also has a tool to help you locate your state's health department, and many state websites also have listings of all of their local health departments.  3) Find a Walk-in Clinic If your illness is not likely to be very severe or complicated, you may want to try a walk in clinic. These are popping up all over the country in pharmacies, drugstores, and shopping centers. They're usually staffed by nurse practitioners or physician assistants that have been trained to treat common illnesses and complaints. They're usually fairly quick and inexpensive. However, if you have serious medical issues or chronic medical problems, these are probably not your best option.  No Primary Care Doctor: - Call Health Connect at  [phone] - they can help you locate a primary care doctor that  accepts your insurance, provides certain services, etc. - Physician Referral Service- [phone]  Chronic Pain Problems: Organization         Address  Phone   Notes  [facility]  [phone] Patients need to be referred by their primary care doctor.   Medication Assistance: Organization         Address  Phone   Notes  [facility] Medication [facility] [address] [place], [place] [address] [phone]) [phone]  --Must be a resident of [facility] [place] -- Must have NO insurance coverage whatsoever (no Medicaid/ Medicare, etc.) -- The pt. MUST have a primary care doctor that directs their care regularly and follows them in the community   Med[facility]ist  [phone]   [employer]  [phone]    Agencies that provide inexpensive medical care: Organization         Address  Phone   Notes  [facility]  [phone]   [facility]    [phone]   [facility] [address] [place], [place] [address] [phone]) [phone]   [facility] [address], [place] [phone]   Planned Parenthood    [phone]   [facility]    [phone]   [facility]  [address], [place]  Phone:  [phone], Fax:  [phone] Hours of Operation:  9 am - 6 pm, M-F.  Also accepts Medicaid/Medicare and self-pay.  [facility]  [address], [place] Phone: [phone]) [phone], Fax: [phone]. Hours of Operation:  8:30 am - 5:30 pm, M-F.  Also accepts Medicaid and self-pay.  [facility] [place] [address], [place] Phone: [phone]) [phone]   [facility] [address], [place], [place] [phone], Ext. [phone] Mondays & Thursdays: 7-9 AM.  First 15 patients are seen on a first come, first serve basis.    Medicaid-accepting [facility] Providers:  Organization         Address  Phone   Notes  [facility] [address], [place] [phone] Also accepts self-pay patients.  [facility] [address], [place]  [phone]   [facility] [address], [place] [phone]) [phone]   [facility] [address] [place] [address], [place] [phone]) [phone]   Renaye RakersVeita Bland [address], [place]   [phone]) [phone] Only  accepts [place] Access [place] patients after they have their name applied to their card.   Self-Pay (no insurance) in [facility]:  Organization         Address  Phone   Notes  Sickle Cell Patients, [facility] [address], [place] [phone]   [facility] [address], [place] [phone]   [facility] [place]  [address], [place] [phone]) [phone]   [facility]/Dr. Osei-Bonsu  [address] [place] [address], [place] or [id number], [place] [phone] Phone number for both [place] and [place] locations is the same.  [facility] [address], [place] [phone]   [facility] [place] [address] [place] [address], [place] or [address] [phone]   [facility] [address], [place] [phone]) [phone], phone; [phone]) [phone], fax Sees patients 1st and 3rd Saturday of every month.  Must not qualify for public or private insurance (i.e. Medicaid, Medicare, [place] Health Choice, Veterans' Benefits)  Household income should be no more than 200% of the poverty level The clinic cannot treat you if you are pregnant or think you are pregnant  Sexually transmitted diseases are not treated at the clinic.    Dental Care: Organization         Address  Phone  Notes  [facility] of [facility] [address], [place] [phone]) [phone] Accepts children up to age 42 who are enrolled in [place] or [place] Health Choice; pregnant women with a Medicaid card; and children who have applied for Medicaid or [place] Health Choice, but were declined, whose parents can pay a reduced fee at time of service.  [facility] of [facility] [place]  [address], [place] [phone] Accepts children up to age 42 who are enrolled in [place] or [place] Health Choice; pregnant  women with a Medicaid card; and children who have applied for Medicaid or [place] Health Choice, but were declined, whose parents can pay a reduced fee at time of service.  [facility]  [address], [place] [phone]) [phone] Patients are seen by appointment  only. Walk-ins are not accepted. [facility] will see patients 67 years of age and older. Monday - Tuesday (8am-5pm) Most Wednesdays (8:30-5pm) $30 per visit, cash only  [facility] Access PRO[facility]  [address], [facility] [phone] Patients are seen by appointment only. Walk-ins are not accepted. [facility] will see patients 22 years of age and older. One Wednesday Evening (Monthly: Volunteer Based).  $30 per visit, cash only  [employer]  [phone] for adults; Children under age 5, call Graduate Pediatric Dentistry at [phone]. Children aged 79-14, please call [phone] to request a pediatric application.  Dental services are provided in all areas of dental care including fillings, crowns and bridges, complete and partial dentures, implants, gum treatment, root canals, and extractions. Preventive care is also provided. Treatment is provided to both adults and children. Patients are selected via a lottery and there is often a waiting list.   [facility] [address], [place]  [phone] [email or web address]   [facility] [address] [place], [place] [phone], Ext. [phone] Second and Fourth Thursday of each month, opens at 6:30 AM; Clinic ends at 9 AM.  Patients are seen on a first-come first-served basis, and a limited number are seen during each clinic.   [facility]  [address] [place], [place] [phone]   Eligibility Requirements You must have lived in [place], [place], or [place] counties for at least the last three months.   You cannot be eligible for state or federal sponsored  [place], including [employer], [place], or [employer].   You generally cannot be eligible for healthcare insurance through your employer.    How to apply: Eligibility screenings are held every Tuesday and Wednesday afternoon from 1:00 pm until 4:00 pm. You do not need an appointment for the interview!  [facility] [address] [place], [place] [phone]   [facility] Health Department  [phone]   [facility] Health Department  [phone]   [facility] Health Department  [phone]    Behavioral Health Resources in the Community: Intensive Outpatient Programs Organization         Address  Phone  Notes  [facility] [address] [place], [place] [phone]   [facility] [address] [place], [place] [phone]   [facility]: [facility] [address] [place], [place]  [phone]   [facility] [address]  [place], [place] [phone] or [phone]   Substance Abuse Resources Organization         Address  Phone  Notes  [facility] Drug Services  [phone]   [facility]  [phone]   [facility]  [phone]   Floydene Flock  [phone]   Residential & Outpatient Substance Abuse Program  [phone]   Psychological Services Organization         Address  Phone  Notes  [facility] Behavioral Health  [phone]   [facility] Services  [phone]   [facility] Mental Health [address] [employer] [phone] or [phone]    Mobile Crisis Teams Organization         Address  Phone  Notes  [facility], Mobile Crisis Care Unit  [phone]   [facility]  [address] [place], [place] [phone]   [facility] [address] [place] [phone]  Self-Help/Support Groups Organization         Address  Phone              Notes  [facility] [place] - variety of support groups  336- I7437963 Call for more information  Narcotics Anonymous (NA), [facility] [address], [employer] [place]  2 meetings at this location   Statistician         Address  Phone  Notes  [facility] [address],    [place]  [phone]   [facility]  [address], [place] [address], [place], [place] [phone]   [facility] [address] [place], [place] [phone] Admissions: 8am-3pm M-F  [facility] [address],    [place], [place] [phone]   [facility] [address] [place], [place], [place] [phone]   [facility] [address],  [place], [place] [phone]   [facility] - Intensive Outpatient [address], [place], [place] [phone]   [facility] ([facility] [address] [place].,  [place], [place] [phone] or [phone]   [facility] ([facility]) [address], [place], [place] [phone] Accepts Medicaid  [facility] [address],  [place] [phone] Substance Abuse/Addiction Treatment   [facility] Organization         Address  Phone  Notes  [facility]  [phone]   Angie Fava, PhD [address] [place], [place]   [phone] or [phone]   [facility] Behavioral   [address] [place], [place] [phone]   [facility] [address], [place], [place] [phone] Insurance/Medicaid/sponsorship through [facility] and Families [address]                                    [place], [place] [phone] Therapy/tele-psych/case  [facility] [address], [place] [phone]    Dr. Lolly Mustache  [phone]   [facility]  [employer] [facility] Dept. 1) [address], [place] 2) [address] [place] 3)  [address], [place] [phone]  [phone]   [facility] Child Abuse Hotline [phone] or [phone] (After Hours)

## 2014-04-01 NOTE — ED Notes (Signed)
R upper posterior dental pain began last week.  Has not seen dentist.  Has treated w/ibuprofen w/out relief.

## 2014-04-04 NOTE — ED Provider Notes (Signed)
Medical screening examination/treatment/procedure(s) were performed by non-physician practitioner and as supervising physician I was immediately available for consultation/collaboration.   EKG Interpretation None        Edward SkeensJoshua M Jacolyn Joaquin, MD 04/04/14 2349

## 2015-01-22 ENCOUNTER — Encounter (HOSPITAL_COMMUNITY): Payer: Self-pay | Admitting: Emergency Medicine

## 2015-01-22 ENCOUNTER — Emergency Department (HOSPITAL_COMMUNITY)
Admission: EM | Admit: 2015-01-22 | Discharge: 2015-01-22 | Disposition: A | Discharge status: Home / Self Care | Payer: Worker's Compensation | Attending: Emergency Medicine | Admitting: Emergency Medicine

## 2015-01-22 ENCOUNTER — Emergency Department (HOSPITAL_COMMUNITY): Payer: Worker's Compensation

## 2015-01-22 DIAGNOSIS — Z880 Allergy status to penicillin: Secondary | ICD-10-CM | POA: Insufficient documentation

## 2015-01-22 DIAGNOSIS — S39012A Strain of muscle, fascia and tendon of lower back, initial encounter: Secondary | ICD-10-CM | POA: Diagnosis not present

## 2015-01-22 DIAGNOSIS — I10 Essential (primary) hypertension: Secondary | ICD-10-CM | POA: Insufficient documentation

## 2015-01-22 DIAGNOSIS — S3992XA Unspecified injury of lower back, initial encounter: Secondary | ICD-10-CM | POA: Diagnosis present

## 2015-01-22 DIAGNOSIS — Z720 Tobacco use: Secondary | ICD-10-CM | POA: Diagnosis not present

## 2015-01-22 DIAGNOSIS — S80811A Abrasion, right lower leg, initial encounter: Secondary | ICD-10-CM | POA: Insufficient documentation

## 2015-01-22 DIAGNOSIS — Y998 Other external cause status: Secondary | ICD-10-CM | POA: Insufficient documentation

## 2015-01-22 DIAGNOSIS — Y92410 Unspecified street and highway as the place of occurrence of the external cause: Secondary | ICD-10-CM | POA: Diagnosis not present

## 2015-01-22 DIAGNOSIS — Y9389 Activity, other specified: Secondary | ICD-10-CM | POA: Insufficient documentation

## 2015-01-22 MED ORDER — IBUPROFEN 600 MG PO TABS: 600.0000 mg | ORAL_TABLET | Freq: Four times a day (QID) | ORAL | Status: AC

## 2015-01-22 MED ORDER — BACITRACIN-NEOMYCIN-POLYMYXIN 400-5-5000 EX OINT
TOPICAL_OINTMENT | CUTANEOUS | Status: AC
  Filled 2015-01-22: qty 1

## 2015-01-22 MED ORDER — BACITRACIN-NEOMYCIN-POLYMYXIN 400-5-5000 EX OINT
TOPICAL_OINTMENT | Freq: Once | CUTANEOUS | Status: AC
  Administered 2015-01-22: 1 via TOPICAL

## 2015-01-22 MED ORDER — DIAZEPAM 5 MG PO TABS
5.0000 mg | ORAL_TABLET | Freq: Once | ORAL | Status: AC
  Administered 2015-01-22: 5 mg via ORAL
  Filled 2015-01-22: qty 1

## 2015-01-22 MED ORDER — KETOROLAC TROMETHAMINE 10 MG PO TABS
10.0000 mg | ORAL_TABLET | Freq: Once | ORAL | Status: AC
  Administered 2015-01-22: 10 mg via ORAL
  Filled 2015-01-22: qty 1

## 2015-01-22 MED ORDER — METHOCARBAMOL 500 MG PO TABS
500.0000 mg | ORAL_TABLET | Freq: Three times a day (TID) | ORAL | Status: DC
Start: 2015-01-22 — End: 2023-08-21
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
## 2018-02-26 ENCOUNTER — Emergency Department (HOSPITAL_COMMUNITY)
Admission: EM | Admit: 2018-02-26 | Discharge: 2018-02-26 | Disposition: A | Discharge status: Home / Self Care | Payer: Self-pay | Attending: Emergency Medicine | Admitting: Emergency Medicine

## 2018-02-26 ENCOUNTER — Encounter (HOSPITAL_COMMUNITY): Payer: Self-pay

## 2018-02-26 ENCOUNTER — Other Ambulatory Visit: Payer: Self-pay

## 2018-02-26 DIAGNOSIS — K0889 Other specified disorders of teeth and supporting structures: Secondary | ICD-10-CM

## 2018-02-26 DIAGNOSIS — F17210 Nicotine dependence, cigarettes, uncomplicated: Secondary | ICD-10-CM | POA: Insufficient documentation

## 2018-02-26 DIAGNOSIS — I10 Essential (primary) hypertension: Secondary | ICD-10-CM | POA: Insufficient documentation

## 2018-02-26 MED ORDER — CLINDAMYCIN HCL 150 MG PO CAPS
300.0000 mg | ORAL_CAPSULE | Freq: Once | ORAL | Status: AC
  Administered 2018-02-26: 300 mg via ORAL
  Filled 2018-02-26: qty 2

## 2018-02-26 MED ORDER — NAPROXEN 500 MG PO TABS: 500.0000 mg | ORAL_TABLET | Freq: Two times a day (BID) | ORAL | 0 refills | Status: DC

## 2018-02-26 MED ORDER — CLINDAMYCIN HCL 300 MG PO CAPS: 300.0000 mg | ORAL_CAPSULE | Freq: Three times a day (TID) | ORAL | 0 refills | Status: DC

## 2018-02-26 MED ORDER — NAPROXEN 250 MG PO TABS
500.0000 mg | ORAL_TABLET | Freq: Once | ORAL | Status: AC
  Administered 2018-02-26: 500 mg via ORAL
  Filled 2018-02-26: qty 2

## 2018-02-26 NOTE — Discharge Instructions (Addendum)
Take the antibiotics as prescribed and follow-up with a dentist.  Return to the ED with difficulty breathing, difficulty swallowing or other concerns.

## 2018-02-26 NOTE — ED Provider Notes (Signed)
[facility] EMERGENCY DEPARTMENT Provider Note   CSN: [id number] Arrival date & time: 02/26/18  0112     History   Chief Complaint Chief Complaint  Patient presents with  . Dental Pain    HPI Alyse LowHarold D Debella is a 47 y.o. male.  Patient states several weeks of left-sided lower dental pain that became acutely worse over the past several days.  He is been using Goody powder Orajel and ibuprofen without relief.  Came in tonight because not been able to sleep.  States he did pull 1 of his lower molars himself about 3 weeks ago this pain is in a different location.  He denies any difficulty breathing or difficulty swallowing.  No chest pain or shortness of breath.  No fever.  No drainage or bleeding.  He does not have a dentist.  The history is provided by the patient.  Dental Pain      Past Medical History:  Diagnosis Date  . Hypertension     There are no active problems to display for this patient.   Past Surgical History:  Procedure Laterality Date  . BACK SURGERY    . EYE SURGERY          Home Medications    Prior to Admission medications   Medication Sig Start Date End Date Taking? Authorizing Provider  Aspirin-Acetaminophen-Caffeine (GOODY HEADACHE PO) Take 1 packet by mouth as needed (for pain).   Yes [provider]  ibuprofen (ADVIL,MOTRIN) 600 MG tablet Take 1 tablet (600 mg total) by mouth 4 (four) times daily. 01/22/15  Yes Ivery QualeBryant, Hobson, PA-C  methocarbamol (ROBAXIN) 500 MG tablet Take 1 tablet (500 mg total) by mouth 3 (three) times daily. 01/22/15   Ivery QualeBryant, Hobson, PA-C    Family History History reviewed. No pertinent family history.  Social History Social History   Tobacco Use  . Smoking status: Current Every Day Smoker    Packs/day: 2.00    Types: Cigarettes  . Smokeless tobacco: Never Used  Substance Use Topics  . Alcohol use: No    Comment: seldom  . Drug use: Yes    Types: Marijuana    Comment: month ago     Allergies     Amoxicillin   Review of Systems Review of Systems  Constitutional: Negative for activity change and appetite change.  HENT: Positive for dental problem. Negative for drooling, facial swelling, rhinorrhea, sore throat and trouble swallowing.   Respiratory: Negative for cough and shortness of breath.   Cardiovascular: Negative for chest pain.  Gastrointestinal: Negative for abdominal pain, nausea and vomiting.  Genitourinary: Negative for dysuria, hematuria and testicular pain.  Musculoskeletal: Negative for arthralgias and myalgias.   all other systems are negative except as noted in the HPI and PMH.     Physical Exam Updated Vital Signs BP (!) 138/93 (BP Location: Right Arm)   Pulse 72   Temp 98 F (36.7 C) (Oral)   Resp 20   Ht 6\' 1"  (1.854 m)   Wt 86.2 kg   SpO2 98%   BMI 25.07 kg/m   Physical Exam  Constitutional: He is oriented to person, place, and time. He appears well-developed and well-nourished. No distress.  HENT:  Head: Normocephalic and atraumatic.  Mouth/Throat: Oropharynx is clear and moist. No oropharyngeal exudate.  Left lower jaw with multiple missing and fractured teeth.  Patient's area of tenderness is along his remaining left lower molar. No gingival fluctuance.  Floor of mouth soft.    Eyes: Pupils are  equal, round, and reactive to light. Conjunctivae and EOM are normal.  Neck: Normal range of motion. Neck supple.  No meningismus.  Cardiovascular: Normal rate, regular rhythm, normal heart sounds and intact distal pulses.  No murmur heard. Pulmonary/Chest: Effort normal and breath sounds normal. No respiratory distress. He exhibits no tenderness.  Abdominal: Soft. There is no tenderness. There is no rebound and no guarding.  Musculoskeletal: Normal range of motion. He exhibits no edema or tenderness.  Neurological: He is alert and oriented to person, place, and time. No cranial nerve deficit. He exhibits normal muscle tone. Coordination normal.  No  ataxia on finger to nose bilaterally. No pronator drift. 5/5 strength throughout. CN 2-12 intact.Equal grip strength. Sensation intact.   Skin: Skin is warm.  Psychiatric: He has a normal mood and affect. His behavior is normal.  Nursing note and vitals reviewed.    ED Treatments / Results  Labs (all labs ordered are listed, but only abnormal results are displayed) Labs Reviewed - No data to display  EKG None  Radiology No results found.  Procedures Procedures (including critical care time)  Medications Ordered in ED Medications  naproxen (NAPROSYN) tablet 500 mg (has no administration in time range)  clindamycin (CLEOCIN) capsule 300 mg (has no administration in time range)     Initial Impression / Assessment and Plan / ED Course  I have reviewed the triage vital signs and the nursing notes.  Pertinent labs & imaging results that were available during my care of the patient were reviewed by me and considered in my medical decision making (see chart for details).    Dental pain with multiple missing and fractured teeth.  There is no evidence of drainable abscess or Ludwig's angina. No difficulty breathing or swallowing.  Patient will be treated with antibiotics and pain control and dental follow-up.  Return precautions discussed. Final Clinical Impressions(s) / ED Diagnoses   Final diagnoses:  Pain, dental    ED Discharge Orders    None       Kendarrius Tanzi, Jeannett Senior, MD 02/26/18 [phone]

## 2018-02-26 NOTE — ED Triage Notes (Signed)
Pt arrives from home via POV c/o rear left dental pain X2 days. Pt has tried Goodies, Sensodyne, Orajel and Ibuprofen with no success for pain control. Pt unable to sleep and has only been able to tolerate room temp food and beverages. Pt recently pulled rear molar by self 3 weeks ago anterior to the affect molar.

## 2018-02-26 NOTE — ED Notes (Signed)
ED Provider at bedside. 

## 2023-07-22 ENCOUNTER — Other Ambulatory Visit: Payer: Self-pay

## 2023-07-22 ENCOUNTER — Emergency Department (HOSPITAL_COMMUNITY)
Admission: EM | Admit: 2023-07-22 | Discharge: 2023-07-22 | Disposition: A | Discharge status: Home / Self Care | Payer: Medicaid Other | Attending: Emergency Medicine | Admitting: Emergency Medicine

## 2023-07-22 DIAGNOSIS — B349 Viral infection, unspecified: Secondary | ICD-10-CM | POA: Insufficient documentation

## 2023-07-22 DIAGNOSIS — I10 Essential (primary) hypertension: Secondary | ICD-10-CM | POA: Diagnosis not present

## 2023-07-22 DIAGNOSIS — J069 Acute upper respiratory infection, unspecified: Secondary | ICD-10-CM | POA: Diagnosis present

## 2023-07-22 DIAGNOSIS — Z20822 Contact with and (suspected) exposure to covid-19: Secondary | ICD-10-CM | POA: Insufficient documentation

## 2023-07-22 DIAGNOSIS — F17210 Nicotine dependence, cigarettes, uncomplicated: Secondary | ICD-10-CM | POA: Insufficient documentation

## 2023-07-22 LAB — RESP PANEL BY RT-PCR (RSV, FLU A&B, COVID)  RVPGX2
Influenza A by PCR: NEGATIVE
Influenza B by PCR: NEGATIVE
Resp Syncytial Virus by PCR: NEGATIVE
SARS Coronavirus 2 by RT PCR: NEGATIVE

## 2023-07-22 NOTE — Discharge Instructions (Signed)
 You were evaluated in the Emergency Department and after careful evaluation, we did not find any emergent condition requiring admission or further testing in the hospital.  Your exam/testing today is overall reassuring.  You have tested negative for COVID-19 here in the emergency department.  Please return to the Emergency Department if you experience any worsening of your condition.   Thank you for allowing us  to be a part of your care.

## 2023-07-22 NOTE — ED Triage Notes (Signed)
 Patient from home for repeat COVID test. Reports symptoms started Sunday; took a home test on Monday and was positive. Wants to repeat test to see if he is still positive. Upon arrival to ER, patient is alert and oriented, ambu

## 2023-07-22 NOTE — ED Provider Notes (Signed)
 [facility]Y DEPT [facility] Emergency Department Provider Note MRN:  [id number]  Arrival date & time: 07/22/23     Chief Complaint   URI   History of Present Illness   [name] is a 53 y.o. year-old male with a history of hypertension presenting to the ED with chief complaint of URI.  Cough and cold-like symptoms for a few days, tested positive for COVID-19 at home.  Needs to test negative for work related reasons, here for a repeat test.  Feels well without bodily complaints at this time.  Review of Systems  A thorough review of systems was obtained and all systems are negative except as noted in the HPI and PMH.   Patient's Health History    Past Medical History:  Diagnosis Date   Hypertension     Past Surgical History:  Procedure Laterality Date   BACK SURGERY     EYE SURGERY      No family history on file.  Social History   Socioeconomic History   Marital status: Married    Spouse name: Not on file   Number of children: Not on file   Years of education: Not on file   Highest education level: Not on file  Occupational History   Not on file  Tobacco Use   Smoking status: Every Day    Current packs/day: 2.00    Types: Cigarettes   Smokeless tobacco: Never  Vaping Use   Vaping status: Never Used  Substance and Sexual Activity   Alcohol use: No    Comment: seldom   Drug use: Yes    Types: Marijuana    Comment: month ago   Sexual activity: Yes  Other Topics Concern   Not on file  Social History Narrative   Not on file   Social Drivers of Health   Financial Resource Strain: Not on file  Food Insecurity: Not on file  Transportation Needs: Not on file  Physical Activity: Not on file  Stress: Not on file  Social Connections: Not on file  Intimate Partner Violence: Not on file     Physical Exam   Vitals:   07/22/23 2110  BP: (!) 123/98  Pulse: 90  Resp: 16  Temp: 98.4 F (36.9 C)  SpO2: 97%    CONSTITUTIONAL: Well-appearing,  NAD NEURO/PSYCH:  Alert and oriented x 3, no focal deficits EYES:  eyes equal and reactive ENT/NECK:  no LAD, no JVD CARDIO: Regular rate, well-perfused, normal S1 and S2 PULM:  CTAB no wheezing or rhonchi GI/GU:  non-distended, non-tender MSK/SPINE:  No gross deformities, no edema SKIN:  no rash, atraumatic   *Additional and/or pertinent findings included in MDM below  Diagnostic and Interventional Summary    EKG Interpretation Date/Time:    Ventricular Rate:    PR Interval:    QRS Duration:    QT Interval:    QTC Calculation:   R Axis:      Text Interpretation:         Labs Reviewed  RESP PANEL BY RT-PCR (RSV, FLU A&B, COVID)  [id number]    No orders to display    Medications - No data to display   Procedures  /  Critical Care Procedures  ED Course and Medical Decision Making  Initial Impression and Ddx Largely a well visit, here for repeat COVID testing.  Vitals reassuring, no acute distress, normal vitals, no complaints.  Past medical/surgical history that increases complexity of ED encounter: None  Interpretation of Diagnostics COVID  flu and RSV negative  Patient Reassessment and Ultimate Disposition/Management     Discharge  Patient management required discussion with the following services or consulting groups:  None  Complexity of Problems Addressed Acute complicated illness or Injury  Additional Data Reviewed and Analyzed Further history obtained from: None  Additional Factors Impacting ED Encounter Risk None  [name]. Theadore, MD [facility] Emergency Medicine [facility] [email or web address]  Final Clinical Impressions(s) / ED Diagnoses     ICD-10-CM   1. Viral syndrome  B34.9       ED Discharge Orders     None        Discharge Instructions Discussed with and Provided to Patient:    Discharge Instructions      You were evaluated in the Emergency Department and after careful evaluation, we did not find any  emergent condition requiring admission or further testing in the hospital.  Your exam/testing today is overall reassuring.  You have tested negative for COVID-19 here in the emergency department.  Please return to the Emergency Department if you experience any worsening of your condition.   Thank you for allowing us  to be a part of your care.      Theadore [name], MD 07/22/23 [phone]

## 2023-08-21 ENCOUNTER — Ambulatory Visit (HOSPITAL_BASED_OUTPATIENT_CLINIC_OR_DEPARTMENT_OTHER)
Admission: EM | Admit: 2023-08-21 | Discharge: 2023-08-21 | Disposition: A | Discharge status: Home / Self Care | Attending: Family Medicine | Admitting: Family Medicine

## 2023-08-21 ENCOUNTER — Encounter (HOSPITAL_BASED_OUTPATIENT_CLINIC_OR_DEPARTMENT_OTHER): Payer: Self-pay | Admitting: Emergency Medicine

## 2023-08-21 DIAGNOSIS — H6123 Impacted cerumen, bilateral: Secondary | ICD-10-CM | POA: Diagnosis not present

## 2023-08-21 DIAGNOSIS — G8929 Other chronic pain: Secondary | ICD-10-CM | POA: Diagnosis not present

## 2023-08-21 DIAGNOSIS — B351 Tinea unguium: Secondary | ICD-10-CM

## 2023-08-21 DIAGNOSIS — M5442 Lumbago with sciatica, left side: Secondary | ICD-10-CM

## 2023-08-21 MED ORDER — DICLOFENAC SODIUM 50 MG PO TBEC: 50.0000 mg | DELAYED_RELEASE_TABLET | Freq: Two times a day (BID) | ORAL | 0 refills | Status: AC | PRN

## 2023-08-21 MED ORDER — CYCLOBENZAPRINE HCL 10 MG PO TABS: 10.0000 mg | ORAL_TABLET | Freq: Every evening | ORAL | 0 refills | Status: AC | PRN

## 2023-08-21 NOTE — ED Provider Notes (Signed)
 [facility]    CSN: [id number] Arrival date & time: 08/21/23  1140      History   Chief Complaint Chief Complaint  Patient presents with   Leg Pain    HPI [name] is a 53 y.o. male.    Patient states that for the past 2-3 weeks, when he sits down, his left leg starts to have pain.  Doesn't bother him while standing.  Patient did have back surgery in 2003 and was informed he had dropped foot but was never followed up with.  Patient does take Tylenol for pain.  No injury to leg.     Leg Pain Associated symptoms: no back pain and no fever     Past Medical History:  Diagnosis Date   Hypertension     There are no active problems to display for this patient.   Past Surgical History:  Procedure Laterality Date   BACK SURGERY     EYE SURGERY         Home Medications    Prior to Admission medications   Medication Sig Start Date End Date Taking? Authorizing Provider  cyclobenzaprine (FLEXERIL) 10 MG tablet Take 1 tablet (10 mg total) by mouth at bedtime as needed for up to 15 days for muscle spasms. Do not use and drive. 08/21/23 09/05/23 Yes Prescilla Sours, FNP  diclofenac (VOLTAREN) 50 MG EC tablet Take 1 tablet (50 mg total) by mouth 2 (two) times daily as needed for up to 15 days. 08/21/23 09/05/23 Yes Prescilla Sours, FNP  Aspirin-Acetaminophen-Caffeine (GOODY HEADACHE PO) Take 1 packet by mouth as needed (for pain).    [provider]  ibuprofen (ADVIL,MOTRIN) 600 MG tablet Take 1 tablet (600 mg total) by mouth 4 (four) times daily. 01/22/15   Ivery Quale, PA-C    Family History Family History  Problem Relation Age of Onset   Healthy Mother     Social History Social History   Tobacco Use   Smoking status: Every Day    Current packs/day: 2.00    Types: Cigarettes   Smokeless tobacco: Never  Vaping Use   Vaping status: Never Used  Substance Use Topics   Alcohol use: No    Comment: seldom   Drug use: Not Currently    Types:  Marijuana    Comment: month ago     Allergies   Amoxicillin   Review of Systems Review of Systems  Constitutional:  Negative for chills and fever.  HENT:  Negative for ear pain and sore throat.   Eyes:  Negative for pain and visual disturbance.  Respiratory:  Negative for cough.   Cardiovascular:  Negative for chest pain and palpitations.  Gastrointestinal:  Negative for abdominal pain, constipation, diarrhea, nausea and vomiting.  Genitourinary:  Negative for dysuria and hematuria.  Musculoskeletal:  Positive for myalgias (left leg pain). Negative for arthralgias and back pain.  Skin:  Negative for color change and rash.  Neurological:  Negative for seizures and syncope.  All other systems reviewed and are negative.    Physical Exam Triage Vital Signs ED Triage Vitals  Encounter Vitals Group     BP 08/21/23 1305 (!) 165/105     Systolic BP Percentile --      Diastolic BP Percentile --      Pulse Rate 08/21/23 1305 77     Resp 08/21/23 1305 18     Temp 08/21/23 1305 98.2 F (36.8 C)     Temp Source 08/21/23 1305 Oral  SpO2 08/21/23 1305 94 %     Weight 08/21/23 1306 180 lb (81.6 kg)     Height 08/21/23 1306 6\' 1"  (1.854 m)     Head Circumference --      Peak Flow --      Pain Score 08/21/23 1306 10     Pain Loc --      Pain Education --      Exclude from Growth Chart --    No data found.  Updated Vital Signs BP (!) 165/105 (BP Location: Right Arm)   Pulse 77   Temp 98.2 F (36.8 C) (Oral)   Resp 18   Ht 6\' 1"  (1.854 m)   Wt 180 lb (81.6 kg)   SpO2 94%   BMI 23.75 kg/m   Visual Acuity Right Eye Distance:   Left Eye Distance:   Bilateral Distance:    Right Eye Near:   Left Eye Near:    Bilateral Near:     Physical Exam Vitals and nursing note reviewed.  Constitutional:      General: He is not in acute distress.    Appearance: He is well-developed. He is not ill-appearing or toxic-appearing.  HENT:     Head: Normocephalic and atraumatic.      Right Ear: Hearing normal. There is impacted cerumen (Completely obstructed with wax.  TM is not visible.).     Left Ear: Hearing normal. There is impacted cerumen (Completely obstructed with wax. TM is not visible.).     Ears:     Comments: Reassessment after ear lavage: Left ear is completely clear and tympanic membrane is normal.  Right ear continues with some wax but is significantly improved and TM is visible and normal.  Patient tolerated the procedure well.    Nose: No congestion or rhinorrhea.     Right Sinus: No maxillary sinus tenderness or frontal sinus tenderness.     Left Sinus: No maxillary sinus tenderness or frontal sinus tenderness.     Mouth/Throat:     Lips: Pink.     Mouth: Mucous membranes are moist.     Pharynx: Uvula midline. No oropharyngeal exudate or posterior oropharyngeal erythema.     Tonsils: No tonsillar exudate.  Eyes:     Conjunctiva/sclera: Conjunctivae normal.     Pupils: Pupils are equal, round, and reactive to light.  Cardiovascular:     Rate and Rhythm: Normal rate and regular rhythm.     Pulses:          Dorsalis pedis pulses are 2+ on the right side and 2+ on the left side.       Posterior tibial pulses are 2+ on the right side and 2+ on the left side.     Heart sounds: S1 normal and S2 normal. No murmur heard. Pulmonary:     Effort: Pulmonary effort is normal. No respiratory distress.     Breath sounds: Normal breath sounds. No decreased breath sounds, wheezing, rhonchi or rales.  Abdominal:     General: Bowel sounds are normal.     Palpations: Abdomen is soft.     Tenderness: There is no abdominal tenderness.  Musculoskeletal:        General: No swelling.     Cervical back: Normal and neck supple.     Thoracic back: Normal.     Lumbar back: Normal.     Right hip: Normal.     Left hip: Tenderness present.     Right upper leg: Normal.  Left upper leg: Normal.     Right knee: Normal.     Left knee: Tenderness (with range of motion and  at the popliteal fossa) present.     Right lower leg: Normal.     Left lower leg: Tenderness present. No swelling. No edema.     Right ankle: Normal.     Right Achilles Tendon: Normal.     Left ankle: Normal.     Left Achilles Tendon: Normal.     Right foot: Normal.     Left foot: Normal.     Comments: Leg Measurements: Thighs/above Knee: R: 36 cm, L: 34 cm.  Knee: R: 36 cm, L: 34.5 cm.  Calf: R: 34 cm, L: 34 cm.  Ankles: R: 22 cm, L: 20 cm.    Feet:     Right foot:     Skin integrity: Skin integrity normal.     Toenail Condition: Right toenails are abnormally thick and long. Fungal disease present.    Left foot:     Skin integrity: Skin integrity normal.     Toenail Condition: Left toenails are abnormally thick and long. Fungal disease present. Lymphadenopathy:     Head:     Right side of head: No submental, submandibular, tonsillar, preauricular or posterior auricular adenopathy.     Left side of head: No submental, submandibular, tonsillar, preauricular or posterior auricular adenopathy.     Cervical: No cervical adenopathy.     Right cervical: No superficial cervical adenopathy.    Left cervical: No superficial cervical adenopathy.  Skin:    General: Skin is warm and dry.     Capillary Refill: Capillary refill takes less than 2 seconds.     Findings: No rash.  Neurological:     Mental Status: He is alert and oriented to person, place, and time.  Psychiatric:        Mood and Affect: Mood normal.      UC Treatments / Results  Labs (all labs ordered are listed, but only abnormal results are displayed) Labs Reviewed - No data to display  EKG   Radiology No results found.  Procedures Procedures (including critical care time)  Medications Ordered in UC Medications - No data to display  Initial Impression / Assessment and Plan / UC Course  I have reviewed the triage vital signs and the nursing notes.  Pertinent labs & imaging results that were available during my  care of the patient were reviewed by me and considered in my medical decision making (see chart for details).     See discharge instructions below for more information.  Encouraged to see podiatry about his fungal toenails.  Ear lavage got his left ear completely empty and right ear is significantly improved.  Regarding his lower back pain and sciatica: Will treat with cyclobenzaprine and diclofenac see below for instructions about medications.  If his back problem persists he needs to see family practice or an orthopedist.  Follow-up if symptoms do not improve, worsen or new symptoms occur. Final Clinical Impressions(s) / UC Diagnoses   Final diagnoses:  Chronic left-sided low back pain with left-sided sciatica  Onychomycosis of toenail  Bilateral impacted cerumen     Discharge Instructions      Needs to see podiatry regarding fungal toenails.  Will treat his sciatica or lower back pain with left leg pain, with cyclobenzaprine, 10 mg nightly, do not use and drive as it could make you sleepy.  Take cyclobenzaprine for muscle spasms.  Use  diclofenac, 50 mg twice daily with food for back and leg pain.  May take it and work or drive.  May take it with cyclobenzaprine.  If leg or back pain persist see family doctor or orthopedist for further workup.  Ear lavage done in the office today.  Ears are cleaner.  Follow-up if symptoms do not improve, worsen or new symptoms occur.      ED Prescriptions     Medication Sig Dispense Auth. Provider   cyclobenzaprine (FLEXERIL) 10 MG tablet Take 1 tablet (10 mg total) by mouth at bedtime as needed for up to 15 days for muscle spasms. Do not use and drive. 15 tablet Prescilla Sours, FNP   diclofenac (VOLTAREN) 50 MG EC tablet Take 1 tablet (50 mg total) by mouth 2 (two) times daily as needed for up to 15 days. 30 tablet Prescilla Sours, FNP      PDMP not reviewed this encounter.   Prescilla Sours, FNP 08/21/23 [phone]

## 2023-08-21 NOTE — Discharge Instructions (Addendum)
 Needs to see podiatry regarding fungal toenails.  Will treat his sciatica or lower back pain with left leg pain, with cyclobenzaprine, 10 mg nightly, do not use and drive as it could make you sleepy.  Take cyclobenzaprine for muscle spasms.  Use diclofenac, 50 mg twice daily with food for back and leg pain.  May take it and work or drive.  May take it with cyclobenzaprine.  If leg or back pain persist see family doctor or orthopedist for further workup.  Ear lavage done in the office today.  Ears are cleaner.  Follow-up if symptoms do not improve, worsen or new symptoms occur.

## 2023-08-21 NOTE — ED Triage Notes (Signed)
 Patient states that for the past 2-3 weeks, when he sits down, his left leg starts to have pain.  Doesn't bother him while standing.  Patient did have back surgery in 2003 and was informed he had dropped foot but was never followed up with.  Patient does take Tylenol for pain.  No injury to leg.
